# Patient Record
Sex: Male | Born: 1955 | Race: Black or African American | Hispanic: No | Marital: Married | Smoking: Current every day smoker
Health system: Southern US, Community
[De-identification: ages and names within clinical notes are randomized; demographics above are authoritative.]

---

## 2016-08-06 ENCOUNTER — Encounter (HOSPITAL_BASED_OUTPATIENT_CLINIC_OR_DEPARTMENT_OTHER): Payer: Self-pay | Admitting: *Deleted

## 2016-08-06 ENCOUNTER — Emergency Department (HOSPITAL_BASED_OUTPATIENT_CLINIC_OR_DEPARTMENT_OTHER): Payer: Self-pay

## 2016-08-06 ENCOUNTER — Emergency Department (HOSPITAL_BASED_OUTPATIENT_CLINIC_OR_DEPARTMENT_OTHER)
Admission: EM | Admit: 2016-08-06 | Discharge: 2016-08-06 | Disposition: A | Discharge status: Home / Self Care | Payer: Self-pay | Attending: Emergency Medicine | Admitting: Emergency Medicine

## 2016-08-06 DIAGNOSIS — Y9389 Activity, other specified: Secondary | ICD-10-CM | POA: Insufficient documentation

## 2016-08-06 DIAGNOSIS — Z23 Encounter for immunization: Secondary | ICD-10-CM | POA: Insufficient documentation

## 2016-08-06 DIAGNOSIS — Y929 Unspecified place or not applicable: Secondary | ICD-10-CM | POA: Insufficient documentation

## 2016-08-06 DIAGNOSIS — S40211A Abrasion of right shoulder, initial encounter: Secondary | ICD-10-CM | POA: Insufficient documentation

## 2016-08-06 DIAGNOSIS — S40212A Abrasion of left shoulder, initial encounter: Secondary | ICD-10-CM | POA: Insufficient documentation

## 2016-08-06 DIAGNOSIS — R0789 Other chest pain: Secondary | ICD-10-CM | POA: Insufficient documentation

## 2016-08-06 DIAGNOSIS — Y999 Unspecified external cause status: Secondary | ICD-10-CM | POA: Insufficient documentation

## 2016-08-06 DIAGNOSIS — T148XXA Other injury of unspecified body region, initial encounter: Secondary | ICD-10-CM

## 2016-08-06 DIAGNOSIS — F17200 Nicotine dependence, unspecified, uncomplicated: Secondary | ICD-10-CM | POA: Insufficient documentation

## 2016-08-06 MED ORDER — TETANUS-DIPHTH-ACELL PERTUSSIS 5-2.5-18.5 LF-MCG/0.5 IM SUSP
0.5000 mL | Freq: Once | INTRAMUSCULAR | Status: AC
  Administered 2016-08-06: 0.5 mL via INTRAMUSCULAR
  Filled 2016-08-06: qty 0.5

## 2016-08-06 MED ORDER — HYDROCODONE-ACETAMINOPHEN 5-325 MG PO TABS
1.0000 | ORAL_TABLET | Freq: Once | ORAL | Status: AC
  Administered 2016-08-06: 1 via ORAL
  Filled 2016-08-06: qty 1

## 2016-08-06 MED ORDER — AZITHROMYCIN 250 MG PO TABS: 250.0000 mg | ORAL_TABLET | Freq: Every day | ORAL | 0 refills | Status: AC

## 2016-08-06 MED ORDER — BACITRACIN ZINC 500 UNIT/GM EX OINT
1.0000 "application " | TOPICAL_OINTMENT | Freq: Two times a day (BID) | CUTANEOUS | Status: DC
  Administered 2016-08-06: 1 via TOPICAL
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
## 2016-08-06 NOTE — Discharge Instructions (Signed)
We see a Place on your right lower lung that may be a small area of pneumonia. Please finish the course of antibiotics and to [facility] or another clinic in one month to have a repeat chest x-ray. If the area on your lung does not clear up, you will need further studies to make sure it is not cancer. It is very important for you to follow-up in one month, or sooner if you have fevers, shortness of breath, or cough.
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
